# Patient Record
Sex: Male | Born: 1970 | Race: Black or African American | Hispanic: No | Marital: Single | Smoking: Current every day smoker
Health system: Southern US, Community
[De-identification: ages and names within clinical notes are randomized; demographics above are authoritative.]

## PROBLEM LIST (undated history)

## (undated) DIAGNOSIS — K759 Inflammatory liver disease, unspecified: Secondary | ICD-10-CM

## (undated) HISTORY — PX: ABDOMINAL SURGERY: SHX537

---

## 2015-12-29 ENCOUNTER — Emergency Department
Admission: EM | Admit: 2015-12-29 | Discharge: 2015-12-29 | Disposition: A | Discharge status: Home / Self Care | Payer: Medicaid Other | Attending: Emergency Medicine | Admitting: Emergency Medicine

## 2015-12-29 ENCOUNTER — Encounter: Payer: Self-pay | Admitting: Emergency Medicine

## 2015-12-29 DIAGNOSIS — F17200 Nicotine dependence, unspecified, uncomplicated: Secondary | ICD-10-CM | POA: Insufficient documentation

## 2015-12-29 DIAGNOSIS — H9201 Otalgia, right ear: Secondary | ICD-10-CM | POA: Diagnosis present

## 2015-12-29 MED ORDER — CIPROFLOXACIN-DEXAMETHASONE 0.3-0.1 % OT SUSP: 4.0000 [drp] | Freq: Two times a day (BID) | OTIC | 0 refills | Status: AC

## 2015-12-29 NOTE — ED Notes (Signed)
Pt discharged home after verbalizing understanding of discharge instructions; nad noted. 

## 2015-12-29 NOTE — ED Triage Notes (Signed)
Pt ambulatory to triage with steady gait, pt c/o right ear pain for the past two months, pt reports pain has increased since last night. Pt denies fever, nausea or vomiting.

## 2015-12-29 NOTE — ED Provider Notes (Signed)
[facility] Emergency Department Provider Note  Time seen: 7:10 AM  I have reviewed the triage vital signs and the nursing notes.   HISTORY  Chief Complaint Otalgia    HPI Jason Parks is a 45 y.o. male with no past medical history who presents the emergency department with right ear pain. According to the patient for the past 1.5 months he has been experiencing intermittent right ear pain worse over the past few days. States is a dull constant pain. Denies any hearing changes. Denies any fever. Denies any other symptoms at this time.  History reviewed. No pertinent past medical history.  There are no active problems to display for this patient.   Past Surgical History:  Procedure Laterality Date  . ABDOMINAL SURGERY      Prior to Admission medications   Not on File    No Known Allergies  History reviewed. No pertinent family history.  Social History Social History  Substance Use Topics  . Smoking status: Current Some Day Smoker  . Smokeless tobacco: Current User  . Alcohol use No    Review of Systems Constitutional: Negative for fever. Cardiovascular: Negative for chest pain. Respiratory: Negative for shortness of breath. Gastrointestinal: Negative for abdominal pain Musculoskeletal: Negative for back pain. Skin: Negative for rash. Neurological: Negative for headache 10-point ROS otherwise negative.  ____________________________________________   PHYSICAL EXAM:  VITAL SIGNS: ED Triage Vitals  Enc Vitals Group     BP 12/29/15 0447 130/84     Pulse Rate 12/29/15 0447 (!) 57     Resp 12/29/15 0447 18     Temp 12/29/15 0447 97.6 F (36.4 C)     Temp src --      SpO2 12/29/15 0447 100 %     Weight 12/29/15 0448 205 lb (93 kg)     Height 12/29/15 0448 6' (1.829 m)     Head Circumference --      Peak Flow --      Pain Score 12/29/15 0448 8     Pain Loc --      Pain Edu? --      Excl. in GC? --     Constitutional: Alert  and oriented. Well appearing and in no distress. Eyes: Normal exam ENT   Head: Normocephalic and atraumatic.Normal tympanic membranes, no obvious auditory canal inflammation and erythema. No foreign bodies. No wax noted   Mouth/Throat: Mucous membranes are moist. Cardiovascular: Normal rate, regular rhythm. No murmur Respiratory: Normal respiratory effort without tachypnea nor retractions. Breath sounds are clear  Gastrointestinal: Soft and nontender. No distention.  Musculoskeletal: Nontender with normal range of motion in all extremities. Neurologic:  Normal speech and language. No gross focal neurologic deficits are appreciated. Skin:  Skin is warm, dry and intact.  Psychiatric: Mood and affect are normal. Speech and behavior are normal.   ____________________________________________   INITIAL IMPRESSION / ASSESSMENT AND PLAN / ED COURSE  Pertinent labs & imaging results that were available during my care of the patient were reviewed by me and considered in my medical decision making (see chart for details).  The patient presents the emergency department with 6 weeks of right ear discomfort worse over the last few days. No obvious abnormality on exam. I discussed the patient follow-up with ENT, we'll prescribe Ciprodex to see if this helps his patient's symptoms.  ____________________________________________   FINAL CLINICAL IMPRESSION(S) / ED DIAGNOSES  Right ear pain    Minna AntisKevin Martavis Gurney, MD 12/29/15 [phone]

## 2016-03-18 ENCOUNTER — Encounter: Payer: Self-pay | Admitting: Emergency Medicine

## 2016-03-18 DIAGNOSIS — F17200 Nicotine dependence, unspecified, uncomplicated: Secondary | ICD-10-CM | POA: Insufficient documentation

## 2016-03-18 DIAGNOSIS — K920 Hematemesis: Secondary | ICD-10-CM | POA: Diagnosis not present

## 2016-03-18 DIAGNOSIS — Z5321 Procedure and treatment not carried out due to patient leaving prior to being seen by health care provider: Secondary | ICD-10-CM | POA: Diagnosis not present

## 2016-03-18 LAB — COMPREHENSIVE METABOLIC PANEL
ALBUMIN: 4.2 g/dL (ref 3.5–5.0)
ALK PHOS: 59 U/L (ref 38–126)
ALT: 36 U/L (ref 17–63)
AST: 32 U/L (ref 15–41)
Anion gap: 6 (ref 5–15)
BILIRUBIN TOTAL: 0.7 mg/dL (ref 0.3–1.2)
BUN: 10 mg/dL (ref 6–20)
CALCIUM: 9 mg/dL (ref 8.9–10.3)
CO2: 26 mmol/L (ref 22–32)
CREATININE: 1.15 mg/dL (ref 0.61–1.24)
Chloride: 107 mmol/L (ref 101–111)
GFR calc Af Amer: >60 mL/min (ref >60)
GFR calc non Af Amer: >60 mL/min (ref >60)
GLUCOSE: 93 mg/dL (ref 65–99)
Potassium: 3.6 mmol/L (ref 3.5–5.1)
Sodium: 139 mmol/L (ref 135–145)
TOTAL PROTEIN: 7.6 g/dL (ref 6.5–8.1)

## 2016-03-18 LAB — CBC
HCT: 47 % (ref 40.0–52.0)
Hemoglobin: 15.9 g/dL (ref 13.0–18.0)
MCH: 28.7 pg (ref 26.0–34.0)
MCHC: 33.7 g/dL (ref 32.0–36.0)
MCV: 85.2 fL (ref 80.0–100.0)
Platelets: 309 10*3/uL (ref 150–440)
RBC: 5.52 MIL/uL (ref 4.40–5.90)
RDW: 13.2 % (ref 11.5–14.5)
WBC: 6.5 10*3/uL (ref 3.8–10.6)
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
## 2016-03-19 ENCOUNTER — Emergency Department
Admission: EM | Admit: 2016-03-19 | Discharge: 2016-03-19 | Disposition: A | Discharge status: Home / Self Care | Payer: Medicaid Other | Attending: Emergency Medicine | Admitting: Emergency Medicine

## 2016-03-19 ENCOUNTER — Telehealth: Payer: Self-pay | Admitting: Emergency Medicine

## 2016-03-19 LAB — TYPE AND SCREEN
ABO/RH(D): B POS
ANTIBODY SCREEN: NEGATIVE

## 2016-03-19 LAB — ETHANOL: Alcohol, Ethyl (B): 42 mg/dL — ABNORMAL HIGH (ref <5)

## 2016-03-19 NOTE — ED Notes (Signed)
Pt no longer noted to be in the lobby or outside of ER
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
## 2016-03-19 NOTE — Telephone Encounter (Signed)
Called patient due to lwot to inquire about condition and follow up plans. Left message.   

## 2017-09-14 ENCOUNTER — Encounter: Payer: Self-pay | Admitting: Emergency Medicine

## 2017-09-14 ENCOUNTER — Other Ambulatory Visit: Payer: Self-pay

## 2017-09-14 ENCOUNTER — Emergency Department
Admission: EM | Admit: 2017-09-14 | Discharge: 2017-09-14 | Disposition: A | Discharge status: Home / Self Care | Payer: Medicaid Other | Attending: Emergency Medicine | Admitting: Emergency Medicine

## 2017-09-14 DIAGNOSIS — K0889 Other specified disorders of teeth and supporting structures: Secondary | ICD-10-CM | POA: Insufficient documentation

## 2017-09-14 DIAGNOSIS — F17210 Nicotine dependence, cigarettes, uncomplicated: Secondary | ICD-10-CM | POA: Insufficient documentation

## 2017-09-14 MED ORDER — HYDROCODONE-ACETAMINOPHEN 5-325 MG PO TABS: 1.0000 | ORAL_TABLET | ORAL | 0 refills | Status: DC | PRN

## 2017-09-14 MED ORDER — MELOXICAM 15 MG PO TABS: 15.0000 mg | ORAL_TABLET | Freq: Every day | ORAL | 0 refills | Status: DC

## 2017-09-14 MED ORDER — MAGIC MOUTHWASH W/LIDOCAINE: 5.0000 mL | Freq: Four times a day (QID) | ORAL | 0 refills | Status: DC

## 2017-09-14 NOTE — ED Triage Notes (Signed)
C/O right upper dental pain.  States had last two upper molars removed 8 days ago and continues to have pain to area.

## 2017-09-14 NOTE — ED Notes (Signed)
Pt states had an upper rt molar pulled 8 days ago and is still having pain. Their plan is to go to a new dentist for follow up. Pt did not have a root canal first and is describing nerve pain.

## 2017-09-14 NOTE — ED Provider Notes (Signed)
[facility] Emergency Department Provider Note  ____________________________________________  Time seen: Approximately 7:40 PM  I have reviewed the triage vital signs and the nursing notes.   HISTORY  Chief Complaint Dental Pain    HPI Jason Parks is a 47 y.o. male who presents the emergency department complaining of dental pain.  Patient reports that he had tooth #1 and 2 #2 extracted 8 days ago.  Patient reports that the pain still persists to this area.  He denies any swelling, fevers or chills, drainage from the site.  Patient reports that it is a "deep pain" but is only localized to the area of extraction.  Patient denies any repeat trauma to the area.  Patient contacted his dentist and was informed that this was "normal."  Patient reports that he is concerned that his symptoms have not been improving.  Patient denies any other injury or complaint.  History reviewed. No pertinent past medical history.  There are no active problems to display for this patient.   Past Surgical History:  Procedure Laterality Date  . ABDOMINAL SURGERY      Prior to Admission medications   Medication Sig Start Date End Date Taking? Authorizing Provider  HYDROcodone-acetaminophen (NORCO/VICODIN) 5-325 MG tablet Take 1 tablet by mouth every 4 (four) hours as needed for moderate pain. 09/14/17   Mintie Witherington, Delorise Royals, PA-C  magic mouthwash w/lidocaine SOLN Take 5 mLs by mouth 4 (four) times daily. 09/14/17   Jaccob Czaplicki, Delorise Royals, PA-C  meloxicam (MOBIC) 15 MG tablet Take 1 tablet (15 mg total) by mouth daily. 09/14/17   Raeshaun Simson, Delorise Royals, PA-C    Allergies Patient has no known allergies.  No family history on file.  Social History Social History   Tobacco Use  . Smoking status: Current Every Day Smoker    Packs/day: 0.50  . Smokeless tobacco: Current User  Substance Use Topics  . Alcohol use: Yes  . Drug use: Not on file     Review of Systems   Constitutional: No fever/chills Eyes: No visual changes. No discharge ENT: N positive for right upper dental pain Cardiovascular: no chest pain. Respiratory: no cough. No SOB. Gastrointestinal: No abdominal pain.  No nausea, no vomiting.  No diarrhea.  No constipation. Musculoskeletal: Negative for musculoskeletal pain. Skin: Negative for rash, abrasions, lacerations, ecchymosis. Neurological: Negative for headaches, focal weakness or numbness. 10-point ROS otherwise negative.  ____________________________________________   PHYSICAL EXAM:  VITAL SIGNS: ED Triage Vitals  Enc Vitals Group     BP 09/14/17 1843 127/75     Pulse Rate 09/14/17 1843 69     Resp 09/14/17 1843 16     Temp 09/14/17 1843 98.2 F (36.8 C)     Temp Source 09/14/17 1843 Oral     SpO2 09/14/17 1843 97 %     Weight 09/14/17 1842 215 lb (97.5 kg)     Height 09/14/17 1842 6' (1.829 m)     Head Circumference --      Peak Flow --      Pain Score 09/14/17 1842 9     Pain Loc --      Pain Edu? --      Excl. in GC? --      Constitutional: Alert and oriented. Well appearing and in no acute distress. Eyes: Conjunctivae are normal. PERRL. EOMI. Head: Atraumatic. ENT:      Ears:       Nose: No congestion/rhinnorhea.      Mouth/Throat: Mucous membranes are moist.  Tooth 1 and tooth #2 are surgically absent.  Area is healing well with no erythema or edema.  No purulent drainage.  No indication of dry socket.  Uvula is midline. Neck: No stridor.   Hematological/Lymphatic/Immunilogical: No cervical lymphadenopathy. Cardiovascular: Normal rate, regular rhythm. Normal S1 and S2.  Good peripheral circulation. Respiratory: Normal respiratory effort without tachypnea or retractions. Lungs CTAB. Good air entry to the bases with no decreased or absent breath sounds. Musculoskeletal: Full range of motion to all extremities. No gross deformities appreciated. Neurologic:  Normal speech and language. No gross focal  neurologic deficits are appreciated.  Skin:  Skin is warm, dry and intact. No rash noted. Psychiatric: Mood and affect are normal. Speech and behavior are normal. Patient exhibits appropriate insight and judgement.   ____________________________________________   LABS (all labs ordered are listed, but only abnormal results are displayed)  Labs Reviewed - No data to display ____________________________________________  EKG   ____________________________________________  RADIOLOGY   No results found.  ____________________________________________    PROCEDURES  Procedure(s) performed:    Procedures    Medications - No data to display   ____________________________________________   INITIAL IMPRESSION / ASSESSMENT AND PLAN / ED COURSE  Pertinent labs & imaging results that were available during my care of the patient were reviewed by me and considered in my medical decision making (see chart for details).  Review of the [place] CSRS was performed in accordance of the [facility] prior to dispensing any controlled drugs.     Patient's diagnosis is consistent with dental pain from dental extraction.  Patient presents 8 days after dental extraction.  Patient continues to experience pain to the region.  No indication of infection.  No retained foreign body.  No indication for work-up at this time.  Patient will be given medications for symptom improvement and advised to follow-up with dentist if no improvement.. Patient will be discharged home with prescriptions for #10 of Vicodin, Magic mouthwash, meloxicam. Patient is to follow up with dentist as needed or otherwise directed. Patient is given ED precautions to return to the ED for any worsening or new symptoms.     ____________________________________________  FINAL CLINICAL IMPRESSION(S) / ED DIAGNOSES  Final diagnoses:  Pain, dental      NEW MEDICATIONS STARTED DURING THIS VISIT:  ED Discharge Orders         Ordered    HYDROcodone-acetaminophen (NORCO/VICODIN) 5-325 MG tablet  Every 4 hours PRN     09/14/17 1944    magic mouthwash w/lidocaine SOLN  4 times daily    Note to Pharmacy:  Dispense in a 1/1/1 ratio. Use lidocaine, diphenhydramine, prednisolone   09/14/17 1944    meloxicam (MOBIC) 15 MG tablet  Daily     09/14/17 1944          This chart was dictated using voice recognition software/Dragon. Despite best efforts to proofread, errors can occur which can change the meaning. Any change was purely unintentional.    Racheal Patches, PA-C 09/14/17 1945    Myrna Blazer, MD 09/14/17 2023

## 2018-06-27 ENCOUNTER — Emergency Department: Payer: Self-pay

## 2018-06-27 ENCOUNTER — Emergency Department
Admission: EM | Admit: 2018-06-27 | Discharge: 2018-06-27 | Disposition: A | Discharge status: Home / Self Care | Payer: Self-pay | Attending: Emergency Medicine | Admitting: Emergency Medicine

## 2018-06-27 ENCOUNTER — Encounter: Payer: Self-pay | Admitting: Emergency Medicine

## 2018-06-27 DIAGNOSIS — F17228 Nicotine dependence, chewing tobacco, with other nicotine-induced disorders: Secondary | ICD-10-CM | POA: Insufficient documentation

## 2018-06-27 DIAGNOSIS — R0981 Nasal congestion: Secondary | ICD-10-CM | POA: Insufficient documentation

## 2018-06-27 DIAGNOSIS — R51 Headache: Secondary | ICD-10-CM | POA: Insufficient documentation

## 2018-06-27 DIAGNOSIS — R05 Cough: Secondary | ICD-10-CM | POA: Insufficient documentation

## 2018-06-27 DIAGNOSIS — F17210 Nicotine dependence, cigarettes, uncomplicated: Secondary | ICD-10-CM | POA: Insufficient documentation

## 2018-06-27 DIAGNOSIS — M791 Myalgia, unspecified site: Secondary | ICD-10-CM | POA: Insufficient documentation

## 2018-06-27 DIAGNOSIS — J101 Influenza due to other identified influenza virus with other respiratory manifestations: Secondary | ICD-10-CM | POA: Insufficient documentation

## 2018-06-27 DIAGNOSIS — R197 Diarrhea, unspecified: Secondary | ICD-10-CM | POA: Insufficient documentation

## 2018-06-27 HISTORY — DX: Inflammatory liver disease, unspecified: K75.9

## 2018-06-27 LAB — COMPREHENSIVE METABOLIC PANEL
ALT: 26 U/L (ref 0–44)
AST: 30 U/L (ref 15–41)
Albumin: 3.9 g/dL (ref 3.5–5.0)
Alkaline Phosphatase: 46 U/L (ref 38–126)
Anion gap: 8 (ref 5–15)
BUN: 12 mg/dL (ref 6–20)
CO2: 21 mmol/L — ABNORMAL LOW (ref 22–32)
Calcium: 8.6 mg/dL — ABNORMAL LOW (ref 8.9–10.3)
Chloride: 105 mmol/L (ref 98–111)
Creatinine, Ser: 1.11 mg/dL (ref 0.61–1.24)
GFR calc Af Amer: >60 mL/min (ref >60)
GFR calc non Af Amer: >60 mL/min (ref >60)
Glucose, Bld: 124 mg/dL — ABNORMAL HIGH (ref 70–99)
Potassium: 3.7 mmol/L (ref 3.5–5.1)
Sodium: 134 mmol/L — ABNORMAL LOW (ref 135–145)
Total Bilirubin: 0.5 mg/dL (ref 0.3–1.2)
Total Protein: 7.3 g/dL (ref 6.5–8.1)

## 2018-06-27 LAB — CBC WITH DIFFERENTIAL/PLATELET
Abs Immature Granulocytes: 0.02 10*3/uL (ref 0.00–0.07)
Basophils Absolute: 0 10*3/uL (ref 0.0–0.1)
Basophils Relative: 1 %
Eosinophils Absolute: 0.1 10*3/uL (ref 0.0–0.5)
Eosinophils Relative: 2 %
HCT: 42.6 % (ref 39.0–52.0)
Hemoglobin: 15.2 g/dL (ref 13.0–17.0)
Immature Granulocytes: 0 %
Lymphocytes Relative: 5 %
Lymphs Abs: 0.3 10*3/uL — ABNORMAL LOW (ref 0.7–4.0)
MCH: 29 pg (ref 26.0–34.0)
MCHC: 35.7 g/dL (ref 30.0–36.0)
MCV: 81.3 fL (ref 80.0–100.0)
Monocytes Absolute: 0.6 10*3/uL (ref 0.1–1.0)
Monocytes Relative: 10 %
Neutro Abs: 5.1 10*3/uL (ref 1.7–7.7)
Neutrophils Relative %: 82 %
Platelets: 260 10*3/uL (ref 150–400)
RBC: 5.24 MIL/uL (ref 4.22–5.81)
RDW: 13.6 % (ref 11.5–15.5)
WBC: 6.1 10*3/uL (ref 4.0–10.5)
nRBC: 0 % (ref 0.0–0.2)

## 2018-06-27 LAB — INFLUENZA PANEL BY PCR (TYPE A & B)
INFLAPCR: POSITIVE — AB
INFLBPCR: NEGATIVE

## 2018-06-27 LAB — TROPONIN I: Troponin I: <0.03 ng/mL (ref <0.03)

## 2018-06-27 MED ORDER — HYDROCOD POLST-CPM POLST ER 10-8 MG/5ML PO SUER: 5.0000 mL | Freq: Two times a day (BID) | ORAL | 0 refills | Status: AC

## 2018-06-27 MED ORDER — ACETAMINOPHEN 500 MG PO TABS
1000.0000 mg | ORAL_TABLET | Freq: Once | ORAL | Status: AC
  Administered 2018-06-27: 1000 mg via ORAL
  Filled 2018-06-27: qty 2

## 2018-06-27 MED ORDER — SODIUM CHLORIDE 0.9 % IV BOLUS
1000.0000 mL | Freq: Once | INTRAVENOUS | Status: AC
Start: 2018-06-27 — End: 2018-06-27
  Administered 2018-06-27: 1000 mL via INTRAVENOUS

## 2018-06-27 MED ORDER — BENZONATATE 100 MG PO CAPS: 100.0000 mg | ORAL_CAPSULE | Freq: Three times a day (TID) | ORAL | 0 refills | Status: AC | PRN

## 2018-06-27 MED ORDER — KETOROLAC TROMETHAMINE 30 MG/ML IJ SOLN
30.0000 mg | Freq: Once | INTRAMUSCULAR | Status: AC
Start: 2018-06-27 — End: 2018-06-27
  Administered 2018-06-27: 30 mg via INTRAVENOUS
  Filled 2018-06-27: qty 1

## 2018-06-27 MED ORDER — BENZONATATE 100 MG PO CAPS
200.0000 mg | ORAL_CAPSULE | Freq: Once | ORAL | Status: AC
  Administered 2018-06-27: 200 mg via ORAL
  Filled 2018-06-27: qty 2

## 2018-06-27 MED ORDER — OSELTAMIVIR PHOSPHATE 75 MG PO CAPS: 75.0000 mg | ORAL_CAPSULE | Freq: Two times a day (BID) | ORAL | 0 refills | Status: AC

## 2018-06-27 NOTE — ED Provider Notes (Signed)
I did not see this patient.  ED ECG REPORT I, Anne-Caroline Sharma Covert, the attending physician, personally viewed and interpreted this ECG.   Date: 06/27/2018  EKG Time: 2231  Rate: 83  Rhythm: normal sinus rhythm  Axis: normal  Intervals:none  ST&T Change: No STEMI    Rockne Menghini, MD 06/27/18 2232

## 2018-06-27 NOTE — ED Provider Notes (Signed)
[facility] Emergency Department Provider Note  ____________________________________________  Time seen: Approximately 11:08 PM  I have reviewed the triage vital signs and the nursing notes.   HISTORY  Chief Complaint Fever; Cough; and Fatigue    HPI Jason Parks is a 48 y.o. male with a history of hypertension, hyperlipidemia, hepatitis and daily smoking, presents to the emergency department with left-sided chest pain worsened with coughing that radiates along the left arm along with rhinorrhea, congestion, nonproductive cough, headache and body aches.  Fever has been as high as 103 F at home.  He has had some diarrhea but no emesis.  He is tolerating fluids by mouth.  No recent travel.  No rash.  Patient reports that he has not experienced similar episodes of chest pain in the past and chest pain is not worsened with exertion. No other alleviating measures have been attempted.    Past Medical History:  Diagnosis Date  . Hepatitis     There are no active problems to display for this patient.   Past Surgical History:  Procedure Laterality Date  . ABDOMINAL SURGERY      Prior to Admission medications   Medication Sig Start Date End Date Taking? Authorizing Provider  HYDROcodone-acetaminophen (NORCO/VICODIN) 5-325 MG tablet Take 1 tablet by mouth every 4 (four) hours as needed for moderate pain. 09/14/17   Cuthriell, Delorise Royals, PA-C  magic mouthwash w/lidocaine SOLN Take 5 mLs by mouth 4 (four) times daily. 09/14/17   Cuthriell, Delorise Royals, PA-C  meloxicam (MOBIC) 15 MG tablet Take 1 tablet (15 mg total) by mouth daily. 09/14/17   Cuthriell, Delorise Royals, PA-C  oseltamivir (TAMIFLU) 75 MG capsule Take 1 capsule (75 mg total) by mouth 2 (two) times daily for 5 days. 06/27/18 07/02/18  Orvil Feil, PA-C    Allergies Patient has no known allergies.  History reviewed. No pertinent family history.  Social History Social History   Tobacco Use  . Smoking  status: Current Every Day Smoker    Packs/day: 0.50  . Smokeless tobacco: Current User  Substance Use Topics  . Alcohol use: Yes  . Drug use: Not on file      Review of Systems  Constitutional: Patient has fever.  Eyes: No visual changes. No discharge ENT: Patient has congestion.  Cardiovascular: Patient has left sided chest pain.  Respiratory: Patient has cough.  Gastrointestinal: No abdominal pain.  No nausea, no vomiting. Patient had diarrhea.  Genitourinary: Negative for dysuria. No hematuria Musculoskeletal: Patient has myalgias.  Skin: Negative for rash, abrasions, lacerations, ecchymosis. Neurological: Patient has headache, no focal weakness or numbness.   ____________________________________________   PHYSICAL EXAM:  VITAL SIGNS: ED Triage Vitals [06/27/18 2044]  Enc Vitals Group     BP 137/87     Pulse Rate 96     Resp 20     Temp (!) 101.9 F (38.8 C)     Temp Source Oral     SpO2 97 %     Weight      Height      Head Circumference      Peak Flow      Pain Score      Pain Loc      Pain Edu?      Excl. in GC?      Constitutional: Alert and oriented. Patient is lying supine. Eyes: Conjunctivae are normal. PERRL. EOMI. Head: Atraumatic. ENT:      Ears: Tympanic membranes are mildly injected with mild effusion  bilaterally.       Nose: No congestion/rhinnorhea.      Mouth/Throat: Mucous membranes are moist. Posterior pharynx is mildly erythematous.  Hematological/Lymphatic/Immunilogical: No cervical lymphadenopathy.  Cardiovascular: Normal rate, regular rhythm. Normal S1 and S2.  Good peripheral circulation. Respiratory: Normal respiratory effort without tachypnea or retractions. Lungs CTAB. Good air entry to the bases with no decreased or absent breath sounds. Gastrointestinal: Bowel sounds 4 quadrants. Soft and nontender to palpation. No guarding or rigidity. No palpable masses. No distention. No CVA tenderness. Musculoskeletal: Full range of motion  to all extremities. No gross deformities appreciated. Neurologic:  Normal speech and language. No gross focal neurologic deficits are appreciated.  Skin:  Skin is warm, dry and intact. No rash noted. Psychiatric: Mood and affect are normal. Speech and behavior are normal. Patient exhibits appropriate insight and judgement.    ____________________________________________   LABS (all labs ordered are listed, but only abnormal results are displayed)  Labs Reviewed  INFLUENZA PANEL BY PCR (TYPE A & B) - Abnormal; Notable for the following components:      Result Value   Influenza A By PCR POSITIVE (*)    All other components within normal limits  CBC WITH DIFFERENTIAL/PLATELET - Abnormal; Notable for the following components:   Lymphs Abs 0.3 (*)    All other components within normal limits  COMPREHENSIVE METABOLIC PANEL - Abnormal; Notable for the following components:   Sodium 134 (*)    CO2 21 (*)    Glucose, Bld 124 (*)    Calcium 8.6 (*)    All other components within normal limits  TROPONIN I   ____________________________________________  EKG  Narrow QRS with no ST segment elevation.  No apparent arrhythmia.  No T wave abnormalities. ____________________________________________  RADIOLOGY I personally viewed and evaluated these images as part of my medical decision making, as well as reviewing the written report by the radiologist.    Dg Chest 2 View  Result Date: 06/27/2018 CLINICAL DATA:  Cough and fever with general malaise x2 days. EXAM: CHEST - 2 VIEW COMPARISON:  None. FINDINGS: Borderline cardiomegaly. Nonaneurysmal thoracic aorta. Increased perihilar interstitial lung markings may be viral in etiology related to mild bronchitic change. There is no pulmonary confluence, however there are subtle nodular opacities in the right upper lobe and left lung base. Pulmonary nodules are not excluded. Chest CT may help for further correlation and for further  disposition/follow-up recommendations. IMPRESSION: Probable bronchitic change of the lungs with increased interstitial lung markings in a perihilar distribution. Subtle nodular opacities may represent pulmonary nodules or vessel seen on end. CT may help determine further disposition. Electronically Signed   By: Tollie Ethavid  Kwon M.D.   On: 06/27/2018 23:12    ____________________________________________    PROCEDURES  Procedure(s) performed:    Procedures    Medications  sodium chloride 0.9 % bolus 1,000 mL (1,000 mLs Intravenous New Bag/Given 06/27/18 2234)  acetaminophen (TYLENOL) tablet 1,000 mg (1,000 mg Oral Given 06/27/18 2048)     ____________________________________________   INITIAL IMPRESSION / ASSESSMENT AND PLAN / ED COURSE  Pertinent labs & imaging results that were available during my care of the patient were reviewed by me and considered in my medical decision making (see chart for details).  Review of the [place] CSRS was performed in accordance of the [facility] prior to dispensing any controlled drugs.      Assessment and Plan:  Influenza a Patient presents to the emergency department with flulike symptoms and left-sided chest pain.  Troponin was within reference range and EKG revealed normal sinus rhythm without ST segment elevation.  CBC and CMP were reassuring.  Patient elected to be treated empirically with Tamiflu.  Rest and hydration were encouraged at home.  Tylenol and ibuprofen alternating for fever were recommended.  Patient was advised to follow-up with primary care as needed.  All patient questions were answered.   ____________________________________________  FINAL CLINICAL IMPRESSION(S) / ED DIAGNOSES  Final diagnoses:  Influenza A      NEW MEDICATIONS STARTED DURING THIS VISIT:  ED Discharge Orders         Ordered    oseltamivir (TAMIFLU) 75 MG capsule  2 times daily     06/27/18 2321              This chart was dictated using voice  recognition software/Dragon. Despite best efforts to proofread, errors can occur which can change the meaning. Any change was purely unintentional.    Orvil Feil, PA-C 06/27/18 2331    Arnaldo Natal, MD 06/27/18 [phone]

## 2018-06-27 NOTE — ED Triage Notes (Addendum)
Pt c/o cough, fever and general malaise x2 days. Last given Motrin at 1830 today as well as ultram. 101.72F in triage.

## 2018-06-30 ENCOUNTER — Encounter: Payer: Self-pay | Admitting: Emergency Medicine

## 2018-06-30 ENCOUNTER — Other Ambulatory Visit: Payer: Self-pay

## 2018-06-30 ENCOUNTER — Emergency Department
Admission: EM | Admit: 2018-06-30 | Discharge: 2018-06-30 | Disposition: A | Discharge status: Home / Self Care | Payer: Self-pay | Attending: Emergency Medicine | Admitting: Emergency Medicine

## 2018-06-30 ENCOUNTER — Emergency Department: Payer: Self-pay

## 2018-06-30 DIAGNOSIS — J188 Other pneumonia, unspecified organism: Secondary | ICD-10-CM | POA: Insufficient documentation

## 2018-06-30 DIAGNOSIS — R112 Nausea with vomiting, unspecified: Secondary | ICD-10-CM | POA: Insufficient documentation

## 2018-06-30 DIAGNOSIS — J189 Pneumonia, unspecified organism: Secondary | ICD-10-CM

## 2018-06-30 DIAGNOSIS — F17210 Nicotine dependence, cigarettes, uncomplicated: Secondary | ICD-10-CM | POA: Insufficient documentation

## 2018-06-30 LAB — URINALYSIS, COMPLETE (UACMP) WITH MICROSCOPIC
BILIRUBIN URINE: NEGATIVE
Bacteria, UA: NONE SEEN
Glucose, UA: NEGATIVE mg/dL
Hgb urine dipstick: NEGATIVE
Ketones, ur: 5 mg/dL — AB
Leukocytes,Ua: NEGATIVE
Nitrite: NEGATIVE
Protein, ur: 30 mg/dL — AB
pH: 5 (ref 5.0–8.0)

## 2018-06-30 LAB — CBC
HCT: 48.4 % (ref 39.0–52.0)
Hemoglobin: 17.1 g/dL — ABNORMAL HIGH (ref 13.0–17.0)
MCH: 28.9 pg (ref 26.0–34.0)
MCHC: 35.3 g/dL (ref 30.0–36.0)
MCV: 81.8 fL (ref 80.0–100.0)
Platelets: 196 10*3/uL (ref 150–400)
RBC: 5.92 MIL/uL — AB (ref 4.22–5.81)
RDW: 13.3 % (ref 11.5–15.5)
WBC: 4.5 10*3/uL (ref 4.0–10.5)
nRBC: 0 % (ref 0.0–0.2)

## 2018-06-30 LAB — COMPREHENSIVE METABOLIC PANEL
ALT: 24 U/L (ref 0–44)
ANION GAP: 10 (ref 5–15)
AST: 32 U/L (ref 15–41)
Albumin: 3.7 g/dL (ref 3.5–5.0)
Alkaline Phosphatase: 52 U/L (ref 38–126)
BUN: 13 mg/dL (ref 6–20)
CO2: 21 mmol/L — AB (ref 22–32)
Calcium: 8.3 mg/dL — ABNORMAL LOW (ref 8.9–10.3)
Chloride: 102 mmol/L (ref 98–111)
Creatinine, Ser: 1.15 mg/dL (ref 0.61–1.24)
GFR calc Af Amer: >60 mL/min (ref >60)
GFR calc non Af Amer: >60 mL/min (ref >60)
GLUCOSE: 117 mg/dL — AB (ref 70–99)
Potassium: 3.9 mmol/L (ref 3.5–5.1)
Sodium: 133 mmol/L — ABNORMAL LOW (ref 135–145)
Total Bilirubin: 0.4 mg/dL (ref 0.3–1.2)
Total Protein: 7.5 g/dL (ref 6.5–8.1)

## 2018-06-30 LAB — LIPASE, BLOOD: Lipase: 33 U/L (ref 11–51)

## 2018-06-30 MED ORDER — IOHEXOL 300 MG/ML  SOLN
100.0000 mL | Freq: Once | INTRAMUSCULAR | Status: AC | PRN
  Administered 2018-06-30: 100 mL via INTRAVENOUS

## 2018-06-30 MED ORDER — SODIUM CHLORIDE 0.9 % IV SOLN
1.0000 g | Freq: Once | INTRAVENOUS | Status: AC
  Administered 2018-06-30: 1 g via INTRAVENOUS
  Filled 2018-06-30: qty 10

## 2018-06-30 MED ORDER — LEVOFLOXACIN 750 MG PO TABS: 750.0000 mg | ORAL_TABLET | Freq: Every day | ORAL | 0 refills | Status: AC

## 2018-06-30 MED ORDER — ONDANSETRON 4 MG PO TBDP
4.0000 mg | ORAL_TABLET | Freq: Once | ORAL | Status: AC | PRN
  Administered 2018-06-30: 4 mg via ORAL
  Filled 2018-06-30: qty 1

## 2018-06-30 MED ORDER — ONDANSETRON 4 MG PO TBDP: 4.0000 mg | ORAL_TABLET | Freq: Three times a day (TID) | ORAL | 0 refills | Status: AC | PRN

## 2018-06-30 MED ORDER — ACETAMINOPHEN 500 MG PO TABS: 500.0000 mg | ORAL_TABLET | Freq: Once | ORAL | Status: DC

## 2018-06-30 MED ORDER — SODIUM CHLORIDE 0.9 % IV SOLN
500.0000 mg | Freq: Once | INTRAVENOUS | Status: AC
  Administered 2018-06-30: 500 mg via INTRAVENOUS
  Filled 2018-06-30: qty 500

## 2018-06-30 MED ORDER — ACETAMINOPHEN 500 MG PO TABS
1000.0000 mg | ORAL_TABLET | Freq: Once | ORAL | Status: AC
  Administered 2018-06-30: 1000 mg via ORAL
  Filled 2018-06-30: qty 2

## 2018-06-30 MED ORDER — IPRATROPIUM-ALBUTEROL 0.5-2.5 (3) MG/3ML IN SOLN
3.0000 mL | Freq: Once | RESPIRATORY_TRACT | Status: AC
  Administered 2018-06-30: 3 mL via RESPIRATORY_TRACT
  Filled 2018-06-30: qty 3

## 2018-06-30 MED ORDER — ONDANSETRON HCL 4 MG/2ML IJ SOLN
4.0000 mg | Freq: Once | INTRAMUSCULAR | Status: AC
  Administered 2018-06-30: 4 mg via INTRAVENOUS
  Filled 2018-06-30: qty 2

## 2018-06-30 MED ORDER — ACETAMINOPHEN 500 MG PO TABS
ORAL_TABLET | ORAL | Status: AC
  Filled 2018-06-30: qty 1

## 2018-06-30 MED ORDER — SODIUM CHLORIDE 0.9 % IV BOLUS
1000.0000 mL | Freq: Once | INTRAVENOUS | Status: AC
  Administered 2018-06-30: 1000 mL via INTRAVENOUS

## 2018-06-30 NOTE — ED Notes (Signed)
Patient to Rm 12 with [place] [facility].
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
## 2018-06-30 NOTE — Discharge Instructions (Addendum)
Please take the entire course of antibiotics for the pneumonia in your lungs, even if you are feeling better.  Please establish a primary care physician that you can follow-up with in 2 days for reevaluation of your pneumonia.  While the antibiotic should improve your symptoms, some people do get worse despite antibiotics, and if you develop increasing shortness of breath, fever that does not respond to Tylenol or Motrin, lightheadedness or fainting, inability to keep down fluids, or any other symptoms concerning to you, please come immediately back to the emergency department.  These take a clear liquid diet for the next 24 hours, then advance to a bland diet as tolerated.

## 2018-06-30 NOTE — ED Notes (Signed)
Pt given bus pass and wheeled to lobby for courtesy car to take him to bus stop

## 2018-06-30 NOTE — ED Triage Notes (Signed)
Pt says he was here 2 days ago and diagnosed with the flu; back today because he says he can't eat anything; anytime he gets food in front of him he gets nauseated; says now his abd hurts because he's hungry but can't eat; pt awake and alert; talking in complete coherent sentences

## 2018-06-30 NOTE — ED Notes (Signed)
Pt given drink and crackers and peanut butter. 

## 2018-06-30 NOTE — ED Notes (Signed)
Pt says he is unable to void at this time; given urine specimen cup, understands we need that as soon as possible

## 2018-06-30 NOTE — ED Notes (Signed)
Pt states he is ready to go home. RN requested pt stay for last round of antibiotics. Pt offered more gingerale-pt denied. Pt also denied any further interventions for comfort at this time. TV remote provided.

## 2020-01-13 IMAGING — CR DG CHEST 2V
1 series · 2 of 2 positions shown · non-contrast
Comparison: None.

CLINICAL DATA: Cough and fever with general malaise x2 days.

EXAM:
CHEST - 2 VIEW

[Series 1: dg chest 2 view · 0.14mm/px · 2 of 2 slices shown]
[im 1/2]
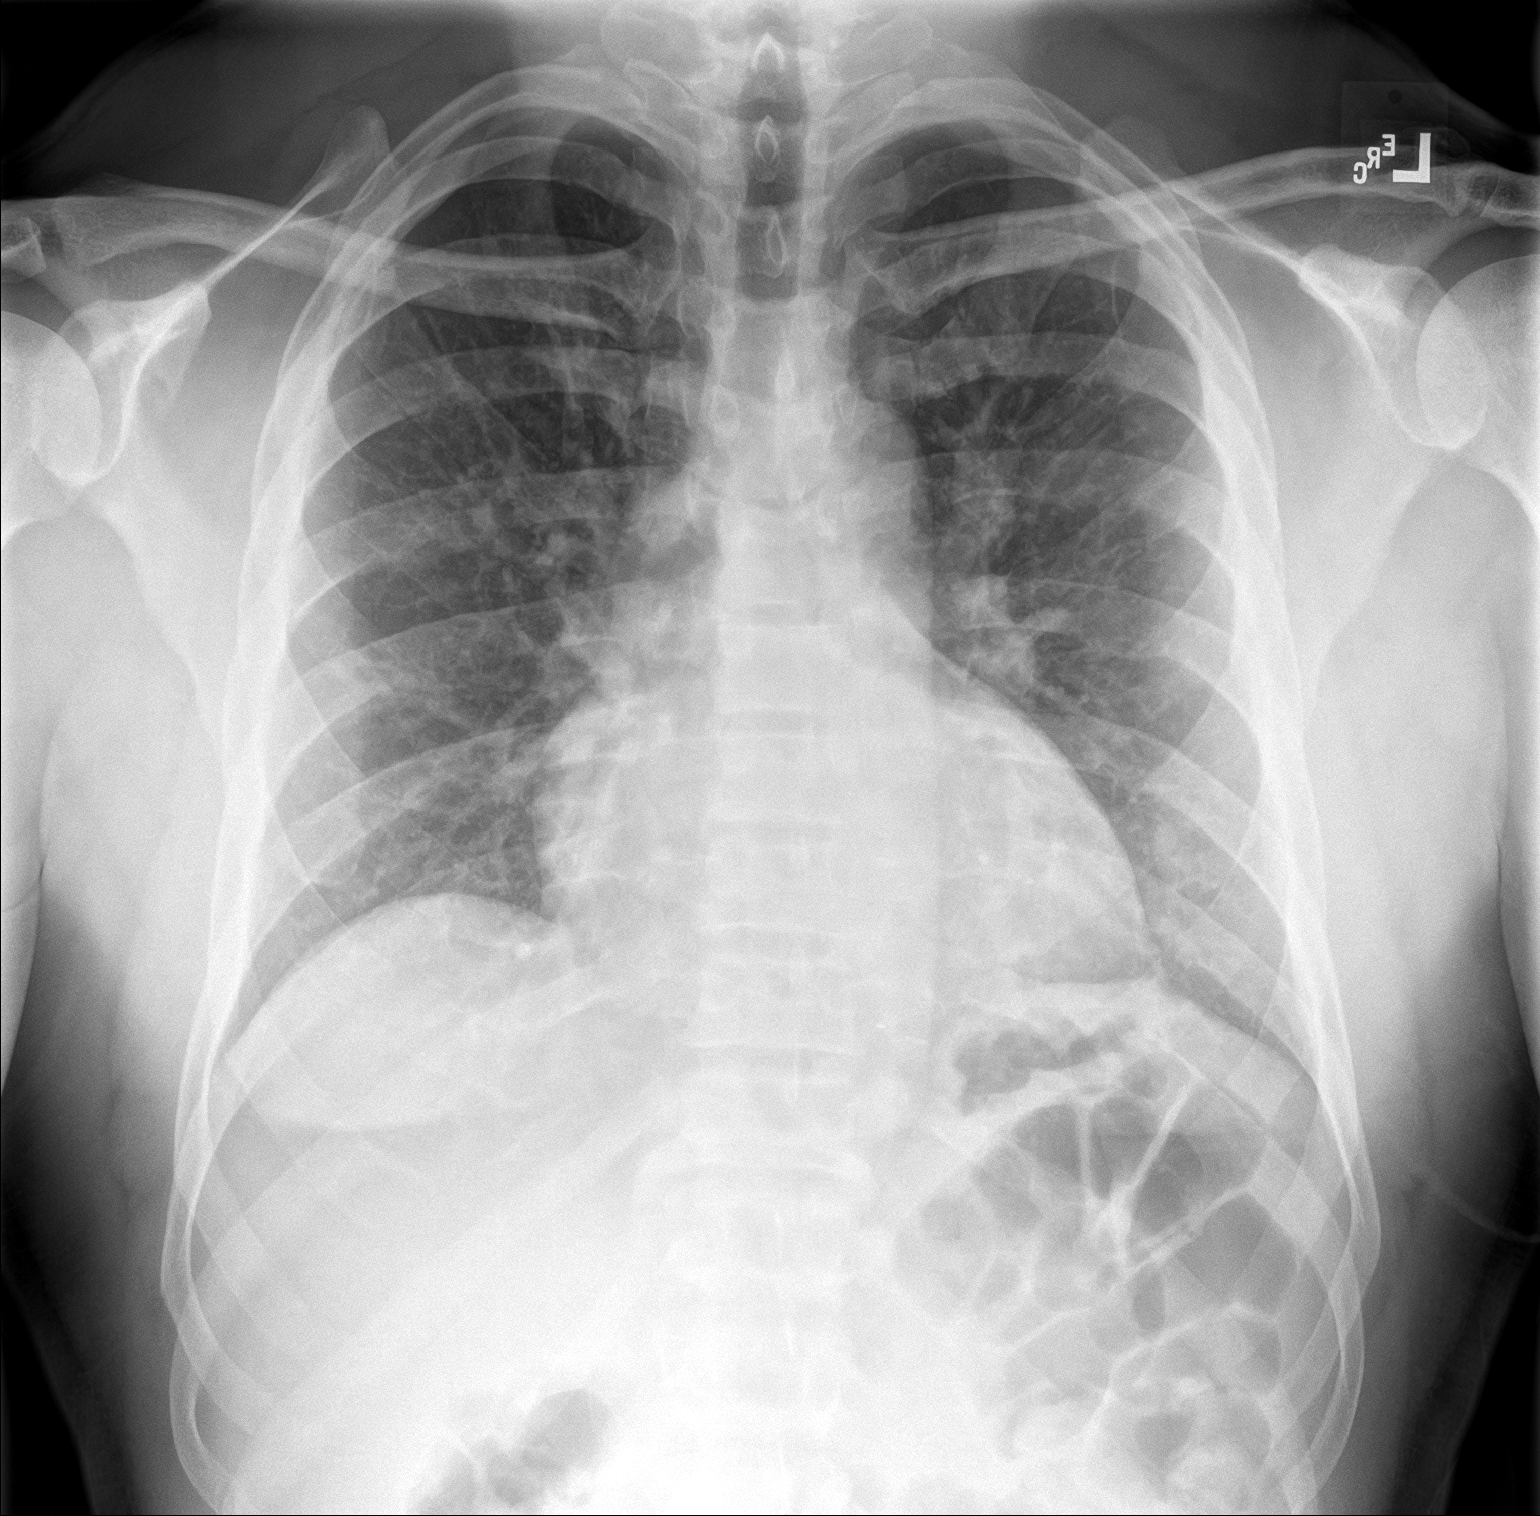
[im 2/2]
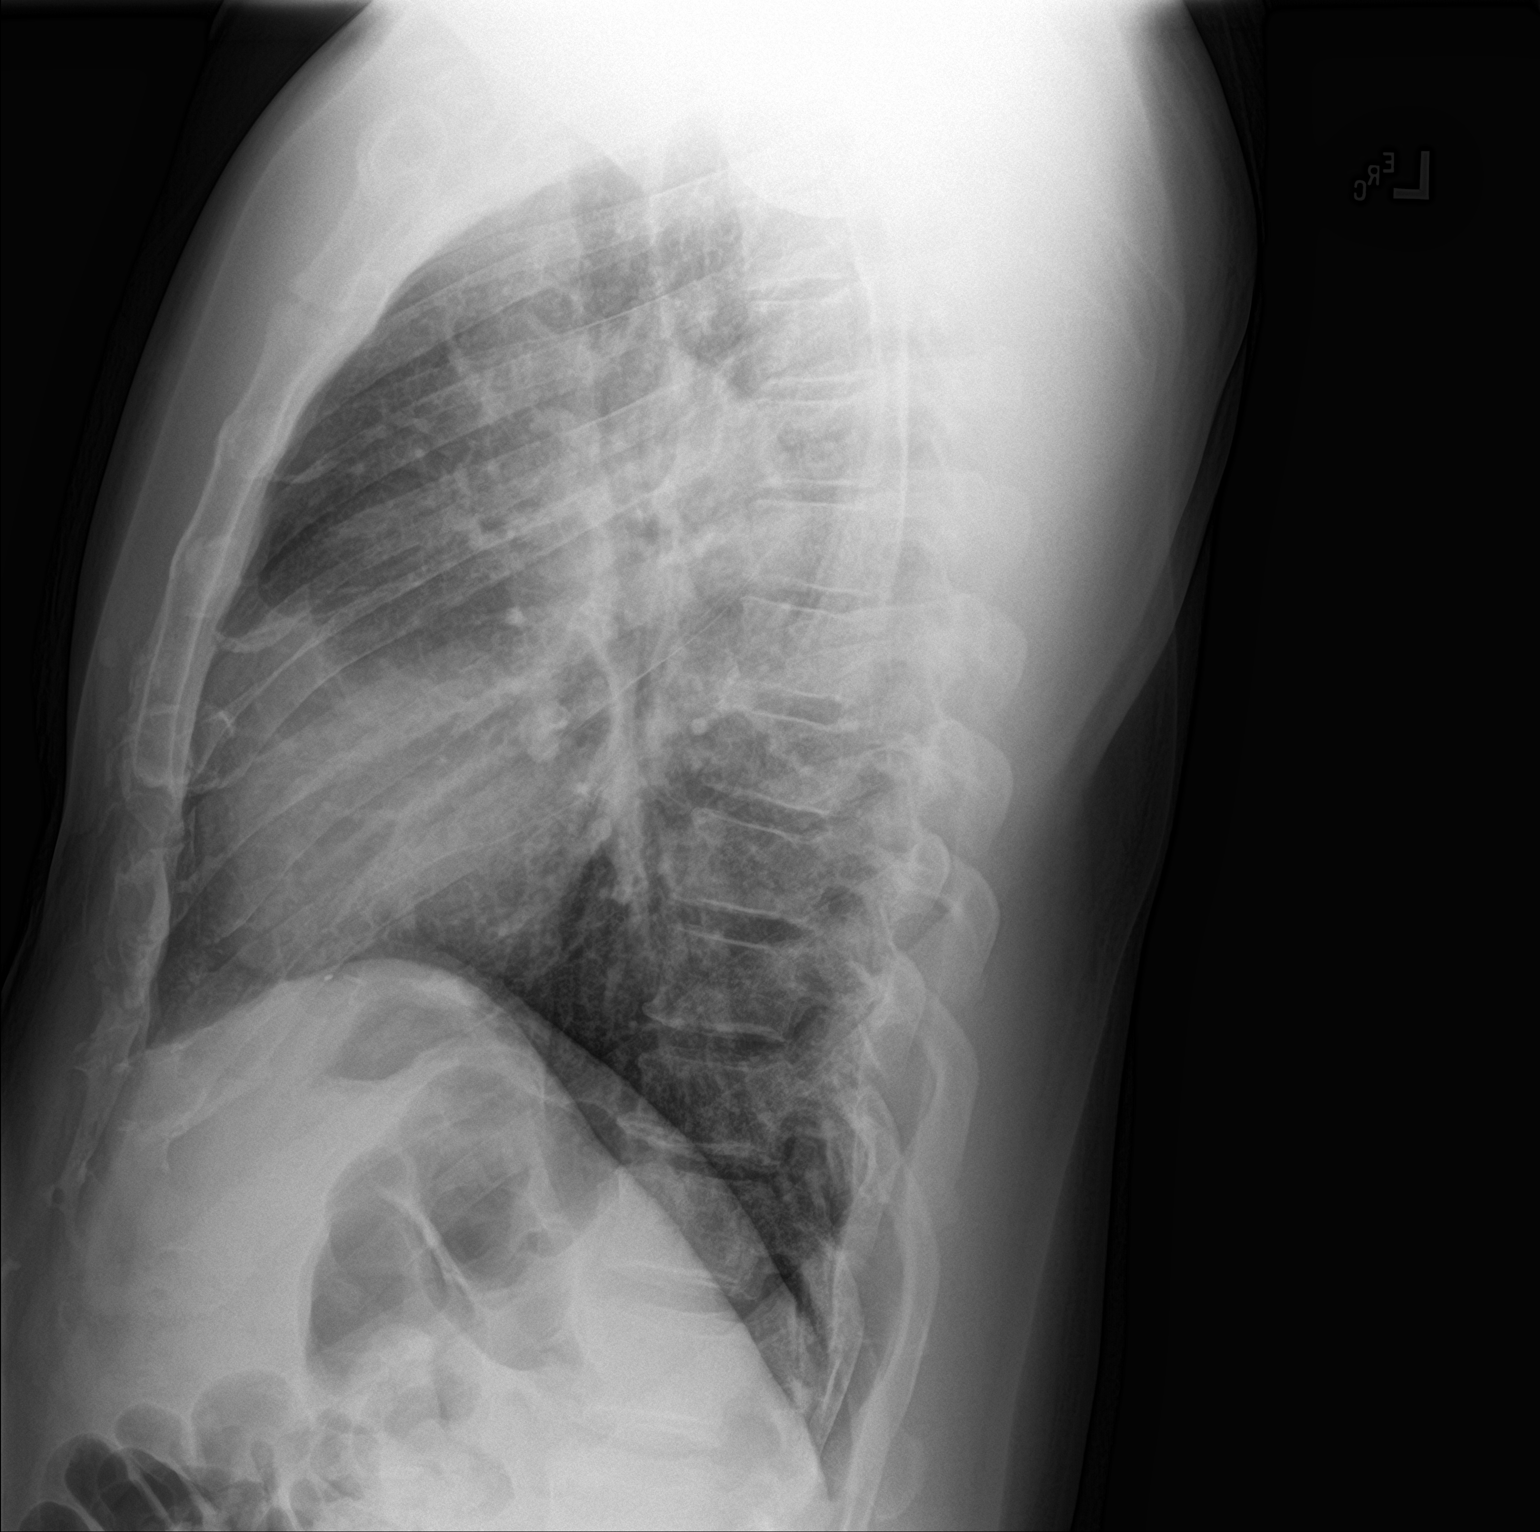

[2 of 2 positions shown; findings below may reference images not displayed]

FINDINGS: Borderline cardiomegaly. Nonaneurysmal thoracic aorta. Increased
perihilar interstitial lung markings may be viral in etiology
related to mild bronchitic change. There is no pulmonary confluence,
however there are subtle nodular opacities in the right upper lobe
and left lung base. Pulmonary nodules are not excluded. Chest CT may
help for further correlation and for further disposition/follow-up
recommendations.
IMPRESSION: Probable bronchitic change of the lungs with increased interstitial
lung markings in a perihilar distribution. Subtle nodular opacities
may represent pulmonary nodules or vessel seen on end. CT may help
determine further disposition.
